# Patient Record
Sex: Female | Born: 1945 | Race: White | Hispanic: No | Marital: Married | Smoking: Never smoker
Health system: Southern US, Community
[De-identification: ages and names within clinical notes are randomized; demographics above are authoritative.]

## PROBLEM LIST (undated history)

## (undated) DIAGNOSIS — M810 Age-related osteoporosis without current pathological fracture: Secondary | ICD-10-CM

## (undated) DIAGNOSIS — M1990 Unspecified osteoarthritis, unspecified site: Secondary | ICD-10-CM

## (undated) DIAGNOSIS — E119 Type 2 diabetes mellitus without complications: Secondary | ICD-10-CM

## (undated) DIAGNOSIS — H269 Unspecified cataract: Secondary | ICD-10-CM

## (undated) DIAGNOSIS — I639 Cerebral infarction, unspecified: Secondary | ICD-10-CM

## (undated) HISTORY — DX: Cerebral infarction, unspecified: I63.9

## (undated) HISTORY — DX: Unspecified cataract: H26.9

## (undated) HISTORY — PX: SPINE SURGERY: SHX786

## (undated) HISTORY — PX: EYE SURGERY: SHX253

## (undated) HISTORY — PX: CHOLECYSTECTOMY: SHX55

## (undated) HISTORY — PX: ABDOMINAL HYSTERECTOMY: SHX81

## (undated) HISTORY — DX: Age-related osteoporosis without current pathological fracture: M81.0

## (undated) HISTORY — DX: Type 2 diabetes mellitus without complications: E11.9

## (undated) HISTORY — DX: Unspecified osteoarthritis, unspecified site: M19.90

---

## 2014-12-31 ENCOUNTER — Ambulatory Visit (INDEPENDENT_AMBULATORY_CARE_PROVIDER_SITE_OTHER): Admitting: Family Medicine

## 2014-12-31 VITALS — BP 127/75 | HR 66 | Temp 97.6°F | Resp 14 | Ht 60.0 in | Wt 186.0 lb

## 2014-12-31 DIAGNOSIS — N390 Urinary tract infection, site not specified: Secondary | ICD-10-CM | POA: Diagnosis not present

## 2014-12-31 DIAGNOSIS — E860 Dehydration: Secondary | ICD-10-CM

## 2014-12-31 DIAGNOSIS — R1113 Vomiting of fecal matter: Secondary | ICD-10-CM | POA: Diagnosis not present

## 2014-12-31 DIAGNOSIS — R1084 Generalized abdominal pain: Secondary | ICD-10-CM | POA: Diagnosis not present

## 2014-12-31 DIAGNOSIS — E1165 Type 2 diabetes mellitus with hyperglycemia: Secondary | ICD-10-CM

## 2014-12-31 DIAGNOSIS — Z794 Long term (current) use of insulin: Secondary | ICD-10-CM | POA: Diagnosis not present

## 2014-12-31 LAB — GLUCOSE, POCT (MANUAL RESULT ENTRY): POC GLUCOSE: 151 mg/dL — AB (ref 70–99)

## 2014-12-31 LAB — POCT URINALYSIS DIP (MANUAL ENTRY)
Bilirubin, UA: NEGATIVE
Glucose, UA: NEGATIVE
Nitrite, UA: NEGATIVE
PH UA: 5.5
SPEC GRAV UA: 1.025
UROBILINOGEN UA: 0.2

## 2014-12-31 LAB — BASIC METABOLIC PANEL
BUN: 19 mg/dL (ref 7–25)
CALCIUM: 9.1 mg/dL (ref 8.6–10.4)
CHLORIDE: 105 mmol/L (ref 98–110)
CO2: 24 mmol/L (ref 20–31)
CREATININE: 0.94 mg/dL — AB (ref 0.60–0.93)
GLUCOSE: 149 mg/dL — AB (ref 65–99)
Potassium: 4.3 mmol/L (ref 3.5–5.3)
SODIUM: 141 mmol/L (ref 135–146)

## 2014-12-31 LAB — POCT CBC
GRANULOCYTE PERCENT: 83.1 % — AB (ref 37–80)
HEMATOCRIT: 34 % — AB (ref 37.7–47.9)
HEMOGLOBIN: 10.2 g/dL — AB (ref 12.2–16.2)
LYMPH, POC: 1.7 (ref 0.6–3.4)
MCH: 26.1 pg — AB (ref 27–31.2)
MCHC: 30.2 g/dL — AB (ref 31.8–35.4)
MCV: 86.5 fL (ref 80–97)
MID (cbc): 0.5 (ref 0–0.9)
MPV: 6.9 fL (ref 0–99.8)
POC GRANULOCYTE: 10.7 — AB (ref 2–6.9)
POC LYMPH PERCENT: 13.1 %L (ref 10–50)
POC MID %: 3.8 %M (ref 0–12)
Platelet Count, POC: 410 10*3/uL (ref 142–424)
RBC: 3.93 M/uL — AB (ref 4.04–5.48)
RDW, POC: 14.7 %
WBC: 12.9 10*3/uL — AB (ref 4.6–10.2)

## 2014-12-31 LAB — POC MICROSCOPIC URINALYSIS (UMFC)
MUCUS RE: ABSENT
Renal tubular cells: POSITIVE

## 2014-12-31 MED ORDER — ONDANSETRON 4 MG PO TBDP: 4.0000 mg | ORAL_TABLET | Freq: Once | ORAL | Status: DC

## 2014-12-31 MED ORDER — LACTULOSE 10 GM/15ML PO SOLN: 10.0000 g | Freq: Every day | ORAL | Status: AC | PRN

## 2014-12-31 MED ORDER — ONDANSETRON 4 MG PO TBDP: 4.0000 mg | ORAL_TABLET | Freq: Three times a day (TID) | ORAL | Status: AC | PRN

## 2014-12-31 MED ORDER — ONDANSETRON 4 MG PO TBDP
8.0000 mg | ORAL_TABLET | Freq: Once | ORAL | Status: AC
  Administered 2014-12-31: 8 mg via ORAL
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
## 2014-12-31 NOTE — Patient Instructions (Addendum)
Increase senokot S to 2 tabs twice a day. Give 2 tabs tonight and 2 tomorrow morning. If Stacy Mendez has not had a stool by tomorrow evening then increase to 3 tabs twice a day and if Stacy Mendez has not had a stool by Sunday evening then increase to 4 tabs twice a day.  If diarrhea develops than slowly decrease the dose by 1 less every morning.  If the senokot S is not working, add in a dose - 1 tablespoon - of the lactulose daily as well in addition to miralax.   Dehydration Dehydration is when you lose more fluids from the body than you take in. Vital organs such as the kidneys, brain, and heart cannot function without a proper amount of fluids and salt. Any loss of fluids from the body can cause dehydration.  Older adults are at a higher risk of dehydration than younger adults. As we age, our bodies are less able to conserve water and do not respond to temperature changes as well. Also, older adults do not become thirsty as easily or quickly. Because of this, older adults often do not realize they need to increase fluids to avoid dehydration.  CAUSES   Vomiting.  Diarrhea.  Excessive sweating.  Excessive urination.  Fever.  Certain medicines, such as blood pressure medicines called diuretics.  Poorly controlled blood sugars. SIGNS AND SYMPTOMS  Mild dehydration:  Thirst.  Dry lips.  Slightly dry mouth. Moderate dehydration:  Very dry mouth.  Sunken eyes.  Skin does not bounce back quickly when lightly pinched and released.  Dark urine and decreased urine production.  Decreased tear production.  Headache. Severe dehydration:  Very dry mouth.  Extreme thirst.  Rapid, weak pulse (more than 100 beats per minute at rest).  Cold hands and feet.  Not able to sweat in spite of heat.  Rapid breathing.  Blue lips.  Confusion and lethargy.  Difficulty being awakened.  Minimal urine production.  No tears. DIAGNOSIS  Your health care provider will diagnose  dehydration based on your symptoms and your exam. Blood and urine tests will help confirm the diagnosis. The diagnostic evaluation should also identify the cause of dehydration. TREATMENT  Treatment of mild or moderate dehydration can often be done at home by increasing the amount of fluids that you drink. It is best to drink small amounts of fluid more often. Drinking too much at one time can make vomiting worse. Severe dehydration needs to be treated at the hospital. You may be given IV fluids that contain water and electrolytes. HOME CARE INSTRUCTIONS   Ask your health care provider about specific rehydration instructions.  Drink enough fluids to keep your urine clear or pale yellow.  Drink small amounts frequently if you have nausea and vomiting.  Eat as you normally do.  Avoid:  Foods or drinks high in sugar.  Carbonated drinks.  Juice.  Extremely hot or cold fluids.  Drinks with caffeine.  Fatty, greasy foods.  Alcohol.  Tobacco.  Overeating.  Gelatin desserts.  Wash your hands well to avoid spreading bacteria and viruses.  Only take over-the-counter or prescription medicines for pain, discomfort, or fever as directed by your health care provider.  Ask your health care provider if you should continue all prescribed and over-the-counter medicines.  Keep all follow-up appointments with your health care provider. SEEK MEDICAL CARE IF:  You have abdominal pain, and it increases or stays in one area (localizes).  You have a rash, stiff neck, or severe headache.  You are irritable, sleepy, or difficult to awaken.  You are weak, dizzy, or extremely thirsty.  You have a fever. SEEK IMMEDIATE MEDICAL CARE IF:   You are unable to keep fluids down, or you get worse despite treatment.  You have frequent episodes of vomiting or diarrhea.  You have blood or green matter (bile) in your vomit.  You have blood in your stool, or your stool looks black and  tarry.  You have not urinated in 6-8 hours, or you have only urinated a small amount of very dark urine.  You faint. MAKE SURE YOU:   Understand these instructions.  Will watch your condition.  Will get help right away if you are not doing well or get worse.   This information is not intended to replace advice given to you by your health care provider. Make sure you discuss any questions you have with your health care provider.   Document Released: 06/02/2003 Document Revised: 03/17/2013 Document Reviewed: 11/17/2012 Elsevier Interactive Patient Education 2016 [employer]  Fecal Impaction A fecal impaction happens when there is a large, firm amount of stool (or feces) that cannot be passed. The impacted stool is usually in the rectum, which is the lowest part of the large bowel. The impacted stool can block the colon and cause significant problems. CAUSES  The longer stool stays in the rectum, the harder it gets. Anything that slows down your bowel movements can lead to fecal impaction, such as:  Constipation. This can be a long-standing (chronic) problem or can happen suddenly (acute).  Painful conditions of the rectum, such as hemorrhoids or anal fissures. The pain of these conditions can make you try to avoid having bowel movements.  Narcotic pain-relieving medicines, such as methadone, morphine, or codeine.  Not drinking enough fluids.  Inactivity and bed rest over long periods of time.  Diseases of the brain or nervous system that damage the nerves controlling the muscles of the intestines. SIGNS AND SYMPTOMS   Lack of normal bowel movements or changes in bowel patterns.  Sense of fullness in the rectum but unable to pass stool.  Pain or cramps in the abdominal area (often after meals).  Thin, watery discharge from the rectum. DIAGNOSIS  Your health care provider may suspect that you have a fecal impaction based on your symptoms and a physical exam. This will  include an exam of your rectum. Sometimes X-rays or lab testing may be needed to confirm the diagnosis and to be sure there are no other problems.  TREATMENT   Initially an impaction can be removed manually. Using a gloved finger, your health care provider can remove hard stool from your rectum.  Medicine is sometimes needed. A suppository or enema can be given in the rectum to soften the stool, which can stimulate a bowel movement. Medicines can also be given by mouth (orally).  Though rare, surgery may be needed if the colon has torn (perforated) due to blockage. HOME CARE INSTRUCTIONS   Develop regular bowel habits. This could include getting in the habit of having a bowel movement after your morning cup of coffee or after eating. Be sure to allow yourself enough time on the toilet.  Maintain a high-fiber diet.  Drink enough fluids to keep your urine clear or pale yellow as directed by your health care provider.  Exercise regularly.  If you begin to get constipated, increase the amount of fiber in your diet. Eat plenty of fruits, vegetables, whole wheat breads, bran, oatmeal,  and similar products.  Take natural fiber laxatives or other laxatives only as directed by your health care provider. SEEK MEDICAL CARE IF:   You have ongoing rectal pain.  You require enemas or suppositories more than twice a week.  You have rectal bleeding.  You have continued problems, or you develop abdominal pain.  You have thin, pencil-like stools. SEEK IMMEDIATE MEDICAL CARE IF:  You have black or tarry stools. MAKE SURE YOU:   Understand these instructions.  Will watch your condition.  Will get help right away if you are not doing well or get worse.   This information is not intended to replace advice given to you by your health care provider. Make sure you discuss any questions you have with your health care provider.   Document Released: 12/03/2003 Document Revised: 12/31/2012 Document  Reviewed: 09/16/2012 Elsevier Interactive Patient Education [employer]

## 2015-01-01 LAB — URINE CULTURE
COLONY COUNT: NO GROWTH
ORGANISM ID, BACTERIA: NO GROWTH

## 2015-01-03 ENCOUNTER — Ambulatory Visit (INDEPENDENT_AMBULATORY_CARE_PROVIDER_SITE_OTHER): Admitting: Family Medicine

## 2015-01-03 ENCOUNTER — Emergency Department (HOSPITAL_COMMUNITY)
Admission: EM | Admit: 2015-01-03 | Discharge: 2015-01-03 | Disposition: A | Discharge status: Home / Self Care | Payer: Medicare Other | Attending: Emergency Medicine | Admitting: Emergency Medicine

## 2015-01-03 ENCOUNTER — Encounter (HOSPITAL_COMMUNITY): Payer: Self-pay | Admitting: *Deleted

## 2015-01-03 ENCOUNTER — Emergency Department (HOSPITAL_COMMUNITY): Payer: Medicare Other

## 2015-01-03 VITALS — BP 140/58 | HR 54 | Temp 98.6°F | Resp 16

## 2015-01-03 DIAGNOSIS — Z7951 Long term (current) use of inhaled steroids: Secondary | ICD-10-CM | POA: Insufficient documentation

## 2015-01-03 DIAGNOSIS — Z79899 Other long term (current) drug therapy: Secondary | ICD-10-CM | POA: Diagnosis not present

## 2015-01-03 DIAGNOSIS — Z792 Long term (current) use of antibiotics: Secondary | ICD-10-CM | POA: Insufficient documentation

## 2015-01-03 DIAGNOSIS — M069 Rheumatoid arthritis, unspecified: Secondary | ICD-10-CM

## 2015-01-03 DIAGNOSIS — Z91040 Latex allergy status: Secondary | ICD-10-CM | POA: Insufficient documentation

## 2015-01-03 DIAGNOSIS — Z794 Long term (current) use of insulin: Secondary | ICD-10-CM | POA: Insufficient documentation

## 2015-01-03 DIAGNOSIS — Z8673 Personal history of transient ischemic attack (TIA), and cerebral infarction without residual deficits: Secondary | ICD-10-CM | POA: Diagnosis not present

## 2015-01-03 DIAGNOSIS — R1030 Lower abdominal pain, unspecified: Secondary | ICD-10-CM

## 2015-01-03 DIAGNOSIS — R1114 Bilious vomiting: Secondary | ICD-10-CM | POA: Diagnosis not present

## 2015-01-03 DIAGNOSIS — R319 Hematuria, unspecified: Secondary | ICD-10-CM

## 2015-01-03 DIAGNOSIS — M810 Age-related osteoporosis without current pathological fracture: Secondary | ICD-10-CM | POA: Diagnosis not present

## 2015-01-03 DIAGNOSIS — Z8669 Personal history of other diseases of the nervous system and sense organs: Secondary | ICD-10-CM | POA: Diagnosis not present

## 2015-01-03 DIAGNOSIS — Z7982 Long term (current) use of aspirin: Secondary | ICD-10-CM | POA: Insufficient documentation

## 2015-01-03 DIAGNOSIS — R109 Unspecified abdominal pain: Secondary | ICD-10-CM | POA: Diagnosis not present

## 2015-01-03 DIAGNOSIS — E119 Type 2 diabetes mellitus without complications: Secondary | ICD-10-CM | POA: Insufficient documentation

## 2015-01-03 DIAGNOSIS — R0989 Other specified symptoms and signs involving the circulatory and respiratory systems: Secondary | ICD-10-CM

## 2015-01-03 DIAGNOSIS — M1990 Unspecified osteoarthritis, unspecified site: Secondary | ICD-10-CM | POA: Insufficient documentation

## 2015-01-03 DIAGNOSIS — Z7902 Long term (current) use of antithrombotics/antiplatelets: Secondary | ICD-10-CM | POA: Insufficient documentation

## 2015-01-03 LAB — CBC WITH DIFFERENTIAL/PLATELET
BASOS PCT: 0 %
Basophils Absolute: 0 10*3/uL (ref 0.0–0.1)
EOS ABS: 0 10*3/uL (ref 0.0–0.7)
Eosinophils Relative: 0 %
HCT: 31.1 % — ABNORMAL LOW (ref 36.0–46.0)
Hemoglobin: 10 g/dL — ABNORMAL LOW (ref 12.0–15.0)
Lymphocytes Relative: 7 %
Lymphs Abs: 1 10*3/uL (ref 0.7–4.0)
MCH: 28.8 pg (ref 26.0–34.0)
MCHC: 32.2 g/dL (ref 30.0–36.0)
MCV: 89.6 fL (ref 78.0–100.0)
MONO ABS: 1 10*3/uL (ref 0.1–1.0)
MONOS PCT: 8 %
NEUTROS PCT: 85 %
Neutro Abs: 11.2 10*3/uL — ABNORMAL HIGH (ref 1.7–7.7)
Platelets: 299 10*3/uL (ref 150–400)
RBC: 3.47 MIL/uL — ABNORMAL LOW (ref 3.87–5.11)
RDW: 13.8 % (ref 11.5–15.5)
WBC: 13.3 10*3/uL — ABNORMAL HIGH (ref 4.0–10.5)

## 2015-01-03 LAB — COMPREHENSIVE METABOLIC PANEL
ALBUMIN: 3.3 g/dL — AB (ref 3.5–5.0)
ALK PHOS: 332 U/L — AB (ref 38–126)
ALT: 92 U/L — ABNORMAL HIGH (ref 14–54)
ANION GAP: 8 (ref 5–15)
AST: 160 U/L — ABNORMAL HIGH (ref 15–41)
BILIRUBIN TOTAL: 0.7 mg/dL (ref 0.3–1.2)
BUN: 15 mg/dL (ref 6–20)
CALCIUM: 9.3 mg/dL (ref 8.9–10.3)
CO2: 27 mmol/L (ref 22–32)
Chloride: 104 mmol/L (ref 101–111)
Creatinine, Ser: 1 mg/dL (ref 0.44–1.00)
GFR calc Af Amer: >60 mL/min (ref >60)
GFR, EST NON AFRICAN AMERICAN: 56 mL/min — AB (ref >60)
GLUCOSE: 188 mg/dL — AB (ref 65–99)
Potassium: 4.2 mmol/L (ref 3.5–5.1)
Sodium: 139 mmol/L (ref 135–145)
TOTAL PROTEIN: 6.5 g/dL (ref 6.5–8.1)

## 2015-01-03 LAB — URINALYSIS, ROUTINE W REFLEX MICROSCOPIC
BILIRUBIN URINE: NEGATIVE
GLUCOSE, UA: NEGATIVE mg/dL
KETONES UR: NEGATIVE mg/dL
Nitrite: NEGATIVE
Specific Gravity, Urine: 1.038 — ABNORMAL HIGH (ref 1.005–1.030)
Urobilinogen, UA: 0.2 mg/dL (ref 0.0–1.0)
pH: 5.5 (ref 5.0–8.0)

## 2015-01-03 LAB — I-STAT CG4 LACTIC ACID, ED: LACTIC ACID, VENOUS: 0.95 mmol/L (ref 0.5–2.0)

## 2015-01-03 LAB — LIPASE, BLOOD: LIPASE: 22 U/L (ref 22–51)

## 2015-01-03 LAB — URINE MICROSCOPIC-ADD ON

## 2015-01-03 MED ORDER — ONDANSETRON HCL 4 MG/2ML IJ SOLN
4.0000 mg | Freq: Once | INTRAMUSCULAR | Status: AC
  Administered 2015-01-03: 4 mg via INTRAVENOUS
  Filled 2015-01-03: qty 2

## 2015-01-03 MED ORDER — IOHEXOL 300 MG/ML  SOLN
100.0000 mL | Freq: Once | INTRAMUSCULAR | Status: AC | PRN
  Administered 2015-01-03: 100 mL via INTRAVENOUS

## 2015-01-03 MED ORDER — MORPHINE SULFATE (PF) 2 MG/ML IV SOLN
2.0000 mg | Freq: Once | INTRAVENOUS | Status: DC
  Filled 2015-01-03: qty 1

## 2015-01-03 MED ORDER — SODIUM CHLORIDE 0.9 % IV SOLN
INTRAVENOUS | Status: DC
  Administered 2015-01-03: 19:00:00 via INTRAVENOUS

## 2015-01-03 NOTE — ED Provider Notes (Signed)
CSN: [id number]     Arrival date & time 01/03/15  1612 History   First MD Initiated Contact with Patient 01/03/15 1708     Chief Complaint  Patient presents with  . Abdominal Pain     (Consider location/radiation/quality/duration/timing/severity/associated sxs/prior Treatment) HPI Patient has had abdominal pain for approximately 3 days. It is lower and aching in quality. The pain is somewhat improved by lying flat. The patient had been seen at urgent care on Friday which was 3 days ago. At that time she was found have stool impaction and was disimpacted. Her daughter reports they had two extremely large amounts of stool that passed at urgent care during the disimpaction process (she indicated to volleyball size volumes of stool). That improved her pain for approximately 1 day. Subsequent to that the pain resumed and has worsened. The pain has been central in nature somewhat peri-umbilical and then lower. Patient and family members reports she's had problems with constipation in the past, she however has not had pain in association with constipation. The fact that she has pain is atypical for her. The patient has been vomiting for the past 2 days. She has eaten very little and has not been in to take in fluids for the past 2 days. She has not had a fever. By description, a culture was done at the urgent care where she was seen and did not show UTI. The patient has been on antibiotics recurrently for signs of infection in her urine. Reportedly she is to be on Macrobid chronically. She was treated with a course of ciprofloxacin approximately 2 weeks ago. The patient is currently finishing courses of Keflex and Levaquin. She has no known history of C. difficile. Family members report however that she has been passing blood in her urine and it has been very dark. Patient has a history of a stroke, she is however interactive and providing history. Past Medical History  Diagnosis Date  . Arthritis   .  Cataract   . Diabetes mellitus without complication (HCC)   . Osteoporosis   . Stroke [facility])     5 strokes April 2015- Jan 2016   Past Surgical History  Procedure Laterality Date  . Cholecystectomy    . Cesarean section    . Eye surgery    . Abdominal hysterectomy    . Spine surgery     Family History  Problem Relation Age of Onset  . Stroke Father   . Stroke Brother    Social History  Substance Use Topics  . Smoking status: Never Smoker   . Smokeless tobacco: None  . Alcohol Use: None   OB History    No data available     Review of Systems 10 Systems reviewed and are negative for acute change except as noted in the HPI.    Allergies  Latex; Methotrexate derivatives; and Prednisone  Home Medications   Prior to Admission medications   Medication Sig Start Date End Date Taking? Authorizing Provider  amLODipine (NORVASC) 10 MG tablet Take 10 mg by mouth daily.   Yes Historical Provider, MD  apixaban (ELIQUIS) 5 MG TABS tablet Take 5 mg by mouth 2 (two) times daily.   Yes Historical Provider, MD  Ascorbic Acid (VITAMIN C) 1000 MG tablet Take 1,000 mg by mouth daily.   Yes Historical Provider, MD  aspirin 81 MG tablet Take 81 mg by mouth daily.   Yes Historical Provider, MD  atorvastatin (LIPITOR) 40 MG tablet Take 40 mg by  mouth daily.   Yes Historical Provider, MD  atropine 1 % ophthalmic solution Place 1-2 drops under the tongue every 2 (two) hours as needed (saliva).  12/04/14  Yes Historical Provider, MD  bethanechol (URECHOLINE) 25 MG tablet Take 25 mg by mouth 6 (six) times daily.   Yes Historical Provider, MD  carvedilol (COREG) 25 MG tablet Take 25 mg by mouth 2 (two) times daily with a meal.   Yes Historical Provider, MD  cephALEXin (KEFLEX) 500 MG capsule Take 500 mg by mouth 3 (three) times daily.   Yes Historical Provider, MD  Cholecalciferol (VITAMIN D-3 PO) Take 1 tablet by mouth daily.    Yes Historical Provider, MD  Cranberry (CRAN-MAX PO) Take 1 tablet by  mouth daily.    Yes Historical Provider, MD  estradiol (ESTRING) 2 MG vaginal ring Place 2 mg vaginally every 3 (three) months. follow package directions   Yes Historical Provider, MD  fluticasone (FLONASE) 50 MCG/ACT nasal spray Place 1 spray into both nostrils daily as needed for allergies.    Yes Historical Provider, MD  folic acid (FOLVITE) 1 MG tablet Take 1 mg by mouth daily.   Yes Historical Provider, MD  furosemide (LASIX) 20 MG tablet Take 20 mg by mouth daily as needed for fluid or edema.    Yes Historical Provider, MD  hydrocortisone cream 1 % Apply 1 application topically as needed for itching.   Yes Historical Provider, MD  Insulin Aspart (NOVOLOG FLEXPEN [place]) Inject 0-10 Units into the skin as needed (bloodsugar).    Yes Historical Provider, MD  insulin detemir (LEVEMIR) 100 UNIT/ML injection Inject 20 Units into the skin daily.    Yes Historical Provider, MD  levETIRAcetam (KEPPRA) 500 MG tablet Take 500 mg by mouth 2 (two) times daily.   Yes Historical Provider, MD  levofloxacin (LEVAQUIN) 500 MG tablet Take 500 mg by mouth 2 (two) times daily.   Yes Historical Provider, MD  levothyroxine (SYNTHROID, LEVOTHROID) 75 MCG tablet Take 75 mcg by mouth daily before breakfast.   Yes Historical Provider, MD  Magnesium Oxide (MAG-OXIDE PO) Take 1 tablet by mouth as needed (w/ lasix).    Yes Historical Provider, MD  methenamine (HIPREX) 1 G tablet Take 1 g by mouth 2 (two) times daily with a meal.   Yes Historical Provider, MD  modafinil (PROVIGIL) 100 MG tablet Take 100 mg by mouth daily.   Yes Historical Provider, MD  ondansetron (ZOFRAN ODT) 4 MG disintegrating tablet Take 1 tablet (4 mg total) by mouth every 8 (eight) hours as needed for nausea or vomiting. 12/31/14  Yes Sherren Mocha, MD  pantoprazole (PROTONIX) 40 MG tablet Take 40 mg by mouth daily.   Yes Historical Provider, MD  Potassium Chloride (KLOR-CON 10 PO) Take 10 mEq by mouth daily as needed (w/ lasix).    Yes Historical Provider,  MD  Sennosides-Docusate Sodium (SENNA S PO) Take 2 tablets by mouth 2 (two) times daily.   Yes Historical Provider, MD  sitaGLIPtin (JANUVIA) 100 MG tablet Take 100 mg by mouth daily.   Yes Historical Provider, MD  lactulose (CHRONULAC) 10 GM/15ML solution Take 15 mLs (10 g total) by mouth daily as needed for mild constipation. Patient not taking: Reported on 01/03/2015 12/31/14   Sherren Mocha, MD   BP 151/60 mmHg  Pulse 70  Temp(Src) 97.9 F (36.6 C) (Oral)  Resp 14  SpO2 93% Physical Exam  Constitutional:  The patient is alert and no respiratory distress. She is  moderately obese.  HENT:  Head: Normocephalic and atraumatic.  Mucous membranes slightly dry.  Eyes: EOM are normal. Pupils are equal, round, and reactive to light. No scleral icterus.  Neck: Neck supple.  Cardiovascular: Normal rate, regular rhythm, normal heart sounds and intact distal pulses.   Pulmonary/Chest: Effort normal and breath sounds normal. No respiratory distress.  Abdominal: She exhibits distension. There is tenderness.  Patient appears to have abdominal wall hernia with moderate distention of the abdomen. She endorses significant tenderness to palpation.  Musculoskeletal: She exhibits no edema or tenderness.  Neurological: She is alert. Coordination abnormal.  Patient has a history of stroke. She is interactive and providing history. She however does have a soft voice with some slurring. She has limitations for extremity use.  Skin: Skin is warm and dry.  Psychiatric: She has a normal mood and affect.    ED Course  Procedures (including critical care time) Labs Review Labs Reviewed  COMPREHENSIVE METABOLIC PANEL - Abnormal; Notable for the following:    Glucose, Bld 188 (*)    Albumin 3.3 (*)    AST 160 (*)    ALT 92 (*)    Alkaline Phosphatase 332 (*)    GFR calc non Af Amer 56 (*)    All other components within normal limits  CBC WITH DIFFERENTIAL/PLATELET - Abnormal; Notable for the following:     WBC 13.3 (*)    RBC 3.47 (*)    Hemoglobin 10.0 (*)    HCT 31.1 (*)    Neutro Abs 11.2 (*)    All other components within normal limits  URINALYSIS, ROUTINE W REFLEX MICROSCOPIC (NOT AT [facility]) - Abnormal; Notable for the following:    Color, Urine AMBER (*)    APPearance TURBID (*)    Specific Gravity, Urine 1.038 (*)    Hgb urine dipstick LARGE (*)    Protein, ur >300 (*)    Leukocytes, UA MODERATE (*)    All other components within normal limits  URINE MICROSCOPIC-ADD ON - Abnormal; Notable for the following:    Bacteria, UA MANY (*)    Casts HYALINE CASTS (*)    All other components within normal limits  LIPASE, BLOOD  I-STAT CG4 LACTIC ACID, ED    Imaging Review Ct Abdomen Pelvis W Contrast  01/03/2015   CLINICAL DATA:  69 year old female with prior strokes. Visiting from [place]. Unexplained abdominal pain. Initial encounter.  EXAM: CT ABDOMEN AND PELVIS WITH CONTRAST  TECHNIQUE: Multidetector CT imaging of the abdomen and pelvis was performed using the standard protocol following bolus administration of intravenous contrast.  CONTRAST:  OMNIPAQUE IOHEXOL 300 MG/ML  SOLN  COMPARISON:  None.  FINDINGS: Cardiomegaly. Calcified Coronary artery atherosclerosis. Lower lobe peribronchovascular in dependent opacity in both lungs. No pleural effusion.  L2 superior endplate compression fracture with mild loss of height is associated with adjacent vacuum disc and superior endplate sclerosis. No paraspinal edema or stranding. This appears nonacute. Postoperative changes at L4-L5. Degenerative lower lumbar spondylolisthesis. No acute osseous abnormality identified.  Advanced widespread calcified atherosclerosis in the abdomen and pelvis, relatively sparing the abdominal aorta. Ventral lower abdominal wall postoperative changes. Rectus muscle diastases.  Mild presacral stranding (series 4, image 64), nonspecific. No pelvic free fluid. Vaginal pessary in place. Uterus surgically absent.  Adnexa diminutive or surgically absent. Negative urinary bladder.  There may be mild wall thickening of the rectum which is fairly decompressed. The sigmoid colon is redundant and tracks into the epigastrium and left upper quadrant  but otherwise appears negative. The left colon is anteriorly located. Negative transverse colon. Retained stool at the hepatic flexure and right colon. Appendix not identified. Negative terminal ileum. No dilated small bowel. Stomach and duodenum are within normal limits. There is some oral contrast still in the distal thoracic esophagus.  No abdominal free fluid or free air. Surgically absent gallbladder with postoperative intra and extrahepatic biliary ductal dilatation. Otherwise negative liver, spleen, pancreas, and adrenal glands. There is renal hilar vascular calcification. Bilateral renal enhancement and contrast excretion is within normal limits. No hydronephrosis or hydroureter. Small pelvic phleboliths.  IMPRESSION: 1. Mild presacral stranding and indistinct appearance of the rectum raising the possibility of mild proctitis. 2. Otherwise no acute or inflammatory finding in the abdomen or pelvis. 3. Bilateral lower lobe peribronchovascular and dependent pulmonary opacity. Aspiration, developing pneumonia difficult to exclude in this setting. 4. Subacute to chronic appearing L2 mild compression fracture. 5. Cardiomegaly. Advanced medium size vessel calcified atherosclerosis throughout the abdomen and pelvis.   Electronically Signed   By: Odessa Fleming M.D.   On: 01/03/2015 20:38   I have personally reviewed and evaluated these images and lab results as part of my medical decision-making.   EKG Interpretation None      MDM   Final diagnoses:  Lower abdominal pain   After treatment the patient indicated she felt improved. She no longer had any significant abdominal pain. CT did not show distinct etiology for the patient's symptoms. There was some inflammation suggesting mild  proctitis. At this time feel this most likely to the disimpaction as described by family members from a very large amount of stool. The patient is currently taking Levaquin and Keflex from prior prescriptions. The patient's pain is actually been more central and mid to lower abdomen. Patient is advised to return if her symptoms should worsen or change. If symptoms are significantly improved over the ensuing 12 hours, the patient will return [place] and see her primary care providers.    Arby Barrette, MD 01/03/15 2226
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
## 2015-01-03 NOTE — ED Notes (Signed)
Pt wasn't able to urinate

## 2015-01-03 NOTE — Discharge Instructions (Signed)
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
## 2015-01-03 NOTE — ED Notes (Addendum)
Per daughter: pt. Unsteady to walk to RR. Pt resting now but will drink contrast to give urine sample.

## 2015-01-04 ENCOUNTER — Telehealth: Payer: Self-pay | Admitting: Family Medicine

## 2015-01-04 NOTE — Telephone Encounter (Signed)
Patient called and left voice mail on lab messages, I returned call and left another message, Onalee Hua, spouse, returned call and notified and voiced understanding. He wanted to thank Dr. Clelia Croft for everything and taking care of Stacy Mendez.

## 2016-06-05 IMAGING — CT CT ABD-PELV W/ CM
2 of 6 series · 16 of 46 positions shown, 18 images · IV contrast (OMNIPAQUE 300)
Comparison: None.

CLINICAL DATA: 69-year-old female with prior strokes. Visiting from
[REDACTED]. Unexplained abdominal pain. Initial encounter.

EXAM:
CT ABDOMEN AND PELVIS WITH CONTRAST
TECHNIQUE: Multidetector CT imaging of the abdomen and pelvis was performed
using the standard protocol following bolus administration of
intravenous contrast.
CONTRAST:  100mL OMNIPAQUE IOHEXOL 300 MG/ML  SOLN

[Series 4: abd/pel with · axial · 0.90mm/px · z∈[-326,+34]mm · 13 of 85 slices shown, 15 images]
[im 7/85  soft-tissue]
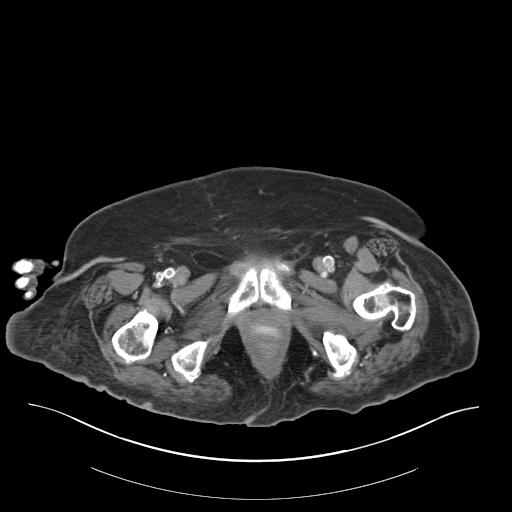
[im 7/85  bone]
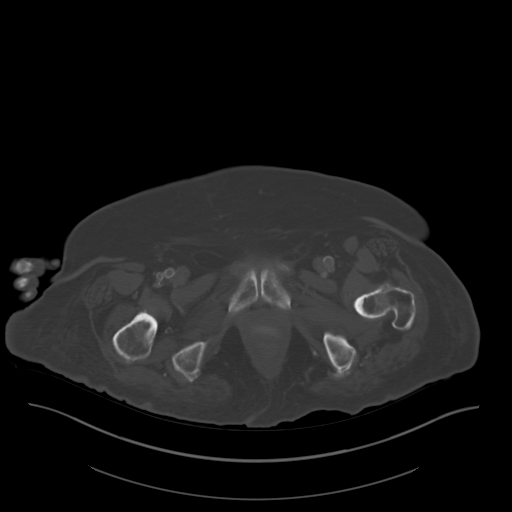
[im 13/85  soft-tissue]
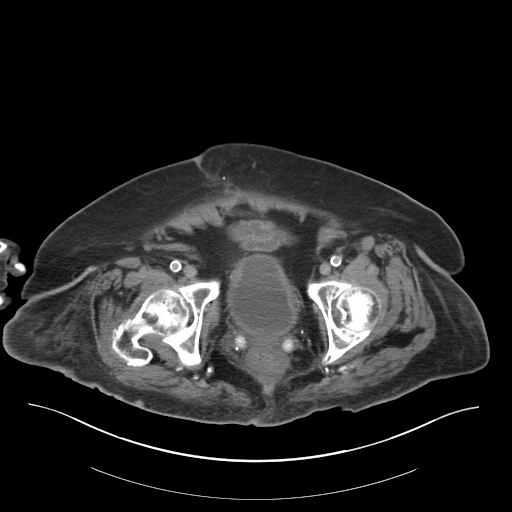
[im 19/85  soft-tissue]
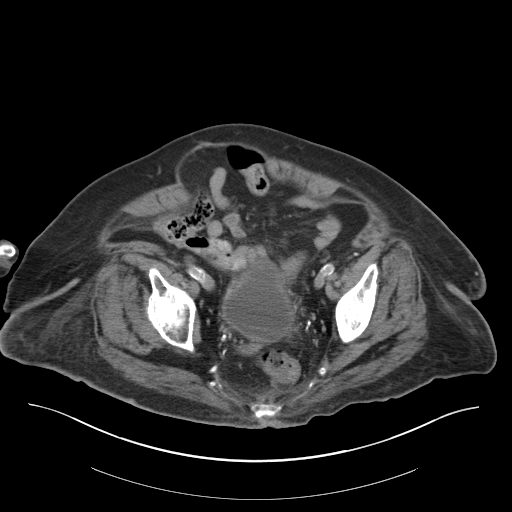
[im 25/85  soft-tissue]
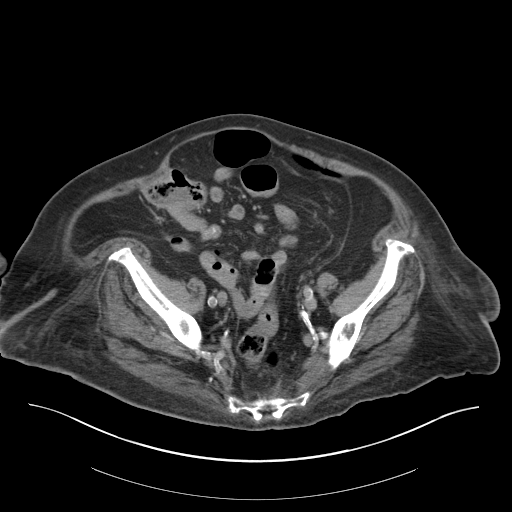
[im 31/85  soft-tissue]
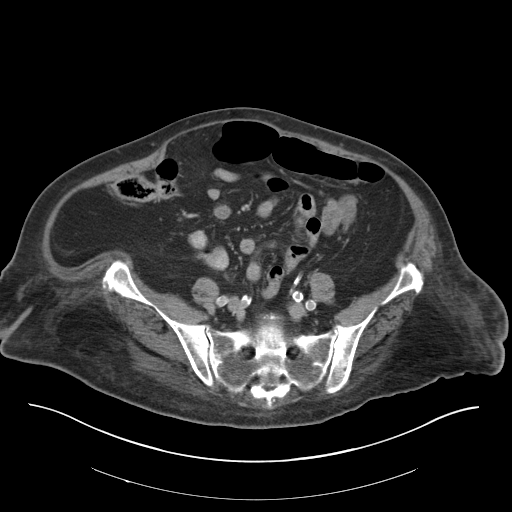
[im 37/85  soft-tissue]
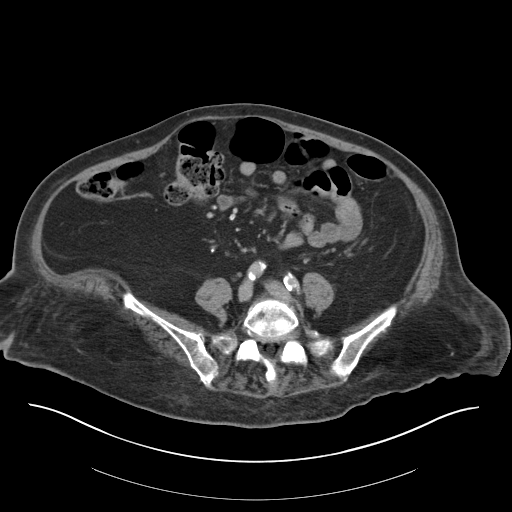
[im 43/85  soft-tissue]
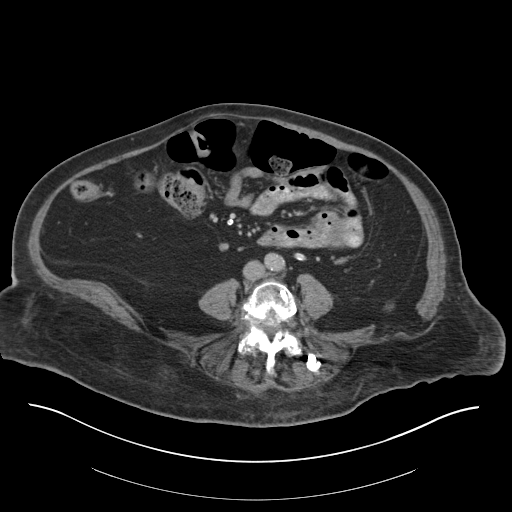
[im 49/85  soft-tissue]
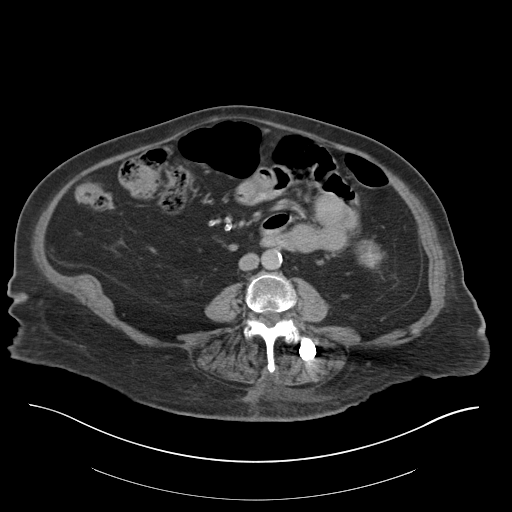
[im 55/85  soft-tissue]
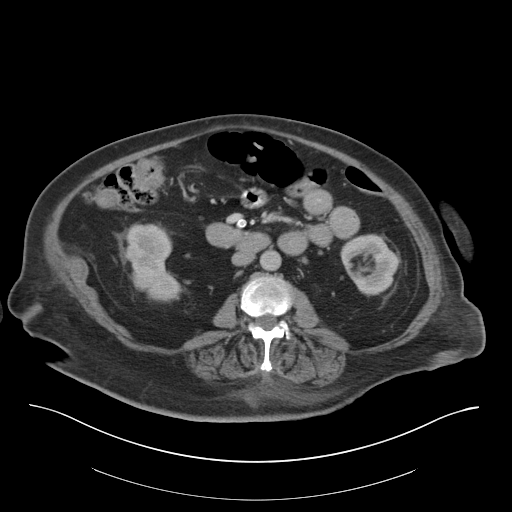
[im 55/85  bone]
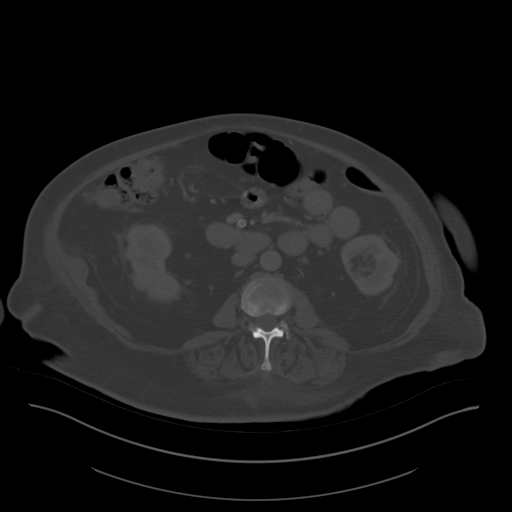
[im 61/85  soft-tissue]
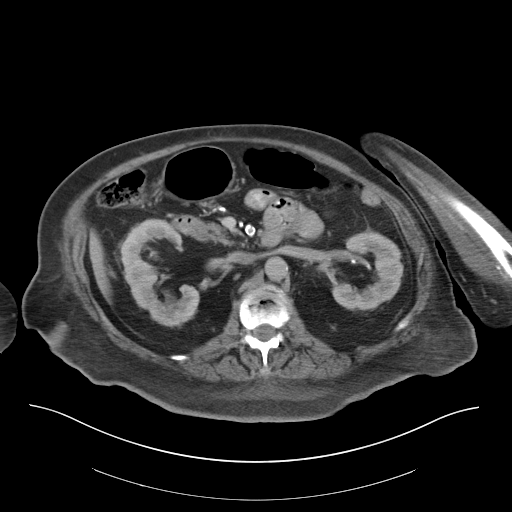
[im 67/85  soft-tissue]
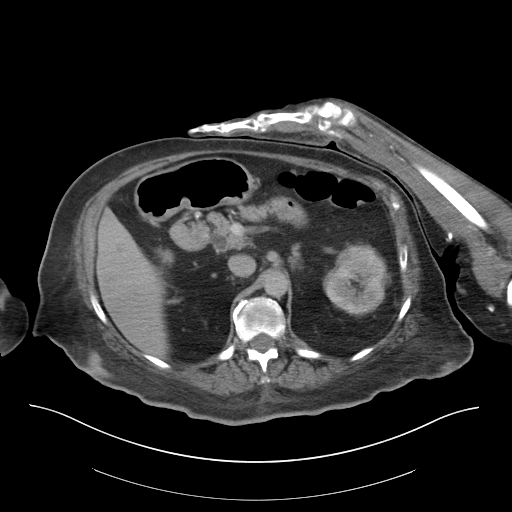
[im 73/85  soft-tissue]
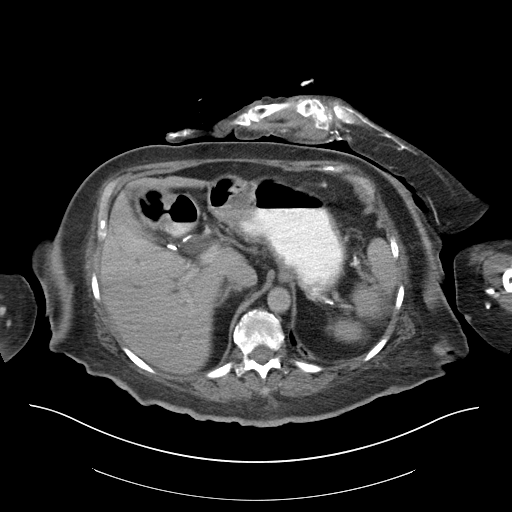
[im 79/85  soft-tissue]
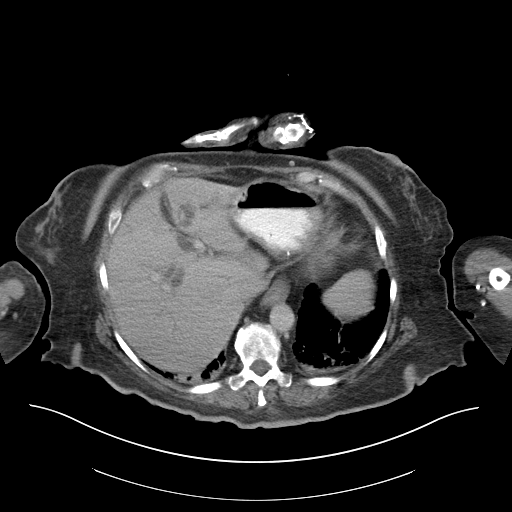

[Series 602: <mpr thick range> · coronal · 0.90mm/px · 3 of 170 slices shown]
[im 57/170  soft-tissue]
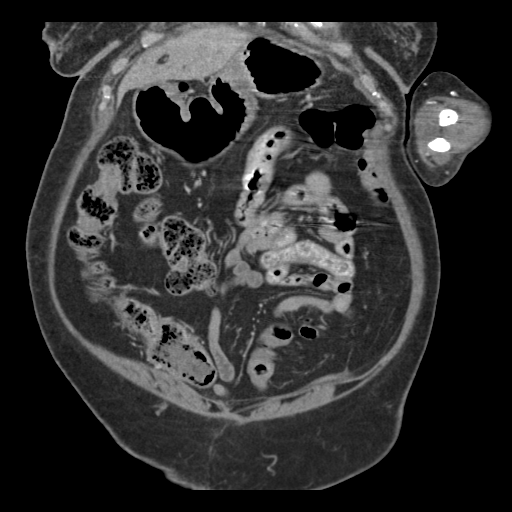
[im 76/170  soft-tissue]
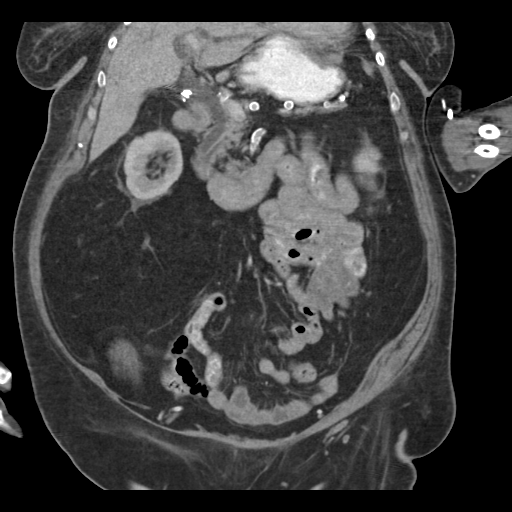
[im 94/170  soft-tissue]
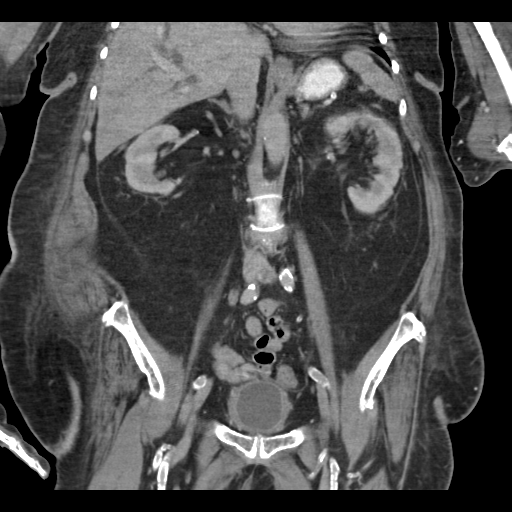

[16 of 46 positions shown; findings below may reference images not displayed]

FINDINGS: Cardiomegaly. Calcified Coronary artery atherosclerosis. Lower lobe
peribronchovascular in dependent opacity in both lungs. No pleural
effusion.

L2 superior endplate compression fracture with mild loss of height
is associated with adjacent vacuum disc and superior endplate
sclerosis. No paraspinal edema or stranding. This appears nonacute.
Postoperative changes at L4-L5. Degenerative lower lumbar
spondylolisthesis. No acute osseous abnormality identified.

Advanced widespread calcified atherosclerosis in the abdomen and
pelvis, relatively sparing the abdominal aorta. Ventral lower
abdominal wall postoperative changes. Rectus muscle diastases.

Mild presacral stranding (series 4, image 64), nonspecific. No
pelvic free fluid. Vaginal pessary in place. Uterus surgically
absent. Adnexa diminutive or surgically absent. Negative urinary
bladder.

There may be mild wall thickening of the rectum which is fairly
decompressed. The sigmoid colon is redundant and tracks into the
epigastrium and left upper quadrant but otherwise appears negative.
The left colon is anteriorly located. Negative transverse colon.
Retained stool at the hepatic flexure and right colon. Appendix not
identified. Negative terminal ileum. No dilated small bowel. Stomach
and duodenum are within normal limits. There is some oral contrast
still in the distal thoracic esophagus.

No abdominal free fluid or free air. Surgically absent gallbladder
with postoperative intra and extrahepatic biliary ductal dilatation.
Otherwise negative liver, spleen, pancreas, and adrenal glands.
There is renal hilar vascular calcification. Bilateral renal
enhancement and contrast excretion is within normal limits. No
hydronephrosis or hydroureter. Small pelvic phleboliths.
IMPRESSION: 1. Mild presacral stranding and indistinct appearance of the rectum
raising the possibility of mild proctitis.
2. Otherwise no acute or inflammatory finding in the abdomen or
pelvis.
3. Bilateral lower lobe peribronchovascular and dependent pulmonary
opacity. Aspiration, developing pneumonia difficult to exclude in
this setting.
4. Subacute to chronic appearing L2 mild compression fracture.
5. Cardiomegaly. Advanced medium size vessel calcified
atherosclerosis throughout the abdomen and pelvis.
